# Patient Record
Sex: Female | Born: 2000 | Race: Black or African American | Hispanic: No | Marital: Single | Smoking: Never smoker
Health system: Southern US, Community
[De-identification: ages and names within clinical notes are randomized; demographics above are authoritative.]

## PROBLEM LIST (undated history)

## (undated) DIAGNOSIS — Z789 Other specified health status: Secondary | ICD-10-CM

## (undated) HISTORY — PX: NO PAST SURGERIES: SHX2092

---
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
## 2023-12-31 ENCOUNTER — Inpatient Hospital Stay (HOSPITAL_COMMUNITY): Admission: RE | Admit: 2023-12-31 | Discharge: 2023-12-31 | Attending: Family Medicine

## 2023-12-31 ENCOUNTER — Encounter (HOSPITAL_COMMUNITY): Payer: Self-pay

## 2023-12-31 ENCOUNTER — Other Ambulatory Visit: Payer: Self-pay

## 2023-12-31 VITALS — BP 127/66 | HR 82 | Temp 98.1°F | Resp 18

## 2023-12-31 DIAGNOSIS — S91209A Unspecified open wound of unspecified toe(s) with damage to nail, initial encounter: Secondary | ICD-10-CM | POA: Diagnosis not present

## 2023-12-31 MED ORDER — DOXYCYCLINE HYCLATE 100 MG PO CAPS: 100.0000 mg | ORAL_CAPSULE | Freq: Two times a day (BID) | ORAL | 0 refills | Status: AC

## 2023-12-31 NOTE — ED Triage Notes (Signed)
 Patient has attempted to move her bed and hit her left great toe in the process.  This occurred Tuesday night.  Toenail does appear slightly raised  patient has not had any medicine for pain

## 2023-12-31 NOTE — ED Provider Notes (Signed)
  [facility]   [id number] 12/31/23 Arrival Time: 1901  ASSESSMENT & PLAN:  1. Avulsion of toenail of left foot    Skin a little red around medial nailfold. Begin: New Prescriptions   DOXYCYCLINE (VIBRAMYCIN) 100 MG CAPSULE    Take 1 capsule (100 mg total) by mouth 2 (two) times daily.    Orders Placed This Encounter  Procedures   Apply Post op shoe  For comfort. Work/school excuse note: declined.  Recommend:  Follow-up Information     Schedule an appointment as soon as possible for a visit  with [facility]).   Why: If worsening or failing to improve as anticipated. Contact information: [address] [place],  [place]  [address]  [phone]                Reviewed expectations re: course of current medical issues. Questions answered. Outlined signs and symptoms indicating need for more acute intervention. Patient verbalized understanding. After Visit Summary given.  SUBJECTIVE: History from: patient. Natalie Lewis is a 23 y.o. female who reports injury to LEFT great toenail. Hit on leg of bed 48 hours ago; mild bleeding. Still a little sore around toenail. Normal ambulation. No tx PTA.  Past Surgical History:  Procedure Laterality Date   NO PAST SURGERIES        OBJECTIVE:  Vitals:   12/31/23 1915  BP: 127/66  Pulse: 82  Resp: 18  Temp: 98.1 F (36.7 C)  TempSrc: Oral  SpO2: 98%    General appearance: alert; no distress Extremities: LLE: warm with well perfused appearance; L great toenail slightly raised distally with dried blood under very distal portion of nail; very TTP; slight erythema around medial nailfold; no active bleeding Skin: warm and dry; no visible rashes Neurologic: gait normal; normal sensation and strength of LLE Psychological: alert and cooperative; normal mood and affect   No Known Allergies  Past Medical History:  Diagnosis Date   Medical history non-contributory    Social  History   Socioeconomic History   Marital status: Single    Spouse name: Not on file   Number of children: Not on file   Years of education: Not on file   Highest education level: Not on file  Occupational History   Not on file  Tobacco Use   Smoking status: Never   Smokeless tobacco: Never  Vaping Use   Vaping status: Some Days  Substance and Sexual Activity   Alcohol use: Yes   Drug use: Yes    Types: Marijuana   Sexual activity: Yes    Birth control/protection: Condom  Other Topics Concern   Not on file  Social History Narrative   Not on file   Social Drivers of Health   Financial Resource Strain: Not on file  Food Insecurity: Not on file  Transportation Needs: Not on file  Physical Activity: Not on file  Stress: Not on file  Social Connections: Not on file   Family History  Problem Relation Age of Onset   Diabetes Mother    Asthma Brother    Mental retardation Brother    Past Surgical History:  Procedure Laterality Date   NO PAST SURGERIES         Rolinda Rogue, MD 12/31/23 1930

## 2024-04-21 ENCOUNTER — Encounter: Admitting: Obstetrics and Gynecology
